# Patient Record
Sex: Male | Born: 1975 | Race: White | Hispanic: No | Marital: Married | Smoking: Never smoker
Health system: Southern US, Academic
[De-identification: ages and names within clinical notes are randomized; demographics above are authoritative.]

## PROBLEM LIST (undated history)

## (undated) DIAGNOSIS — M4850XA Collapsed vertebra, not elsewhere classified, site unspecified, initial encounter for fracture: Secondary | ICD-10-CM

## (undated) DIAGNOSIS — I10 Essential (primary) hypertension: Secondary | ICD-10-CM

## (undated) HISTORY — PX: ROTATOR CUFF REPAIR W/ DISTAL CLAVICLE EXCISION: SHX2365

---

## 2005-02-13 ENCOUNTER — Emergency Department (HOSPITAL_COMMUNITY): Payer: Self-pay

## 2022-06-17 ENCOUNTER — Emergency Department (HOSPITAL_COMMUNITY): Payer: Worker's Compensation

## 2022-06-17 ENCOUNTER — Emergency Department
Admission: EM | Admit: 2022-06-17 | Discharge: 2022-06-17 | Disposition: A | Discharge status: Home / Self Care | Payer: Worker's Compensation | Attending: Family | Admitting: Family

## 2022-06-17 ENCOUNTER — Encounter (HOSPITAL_COMMUNITY): Payer: Self-pay

## 2022-06-17 ENCOUNTER — Other Ambulatory Visit: Payer: Self-pay

## 2022-06-17 DIAGNOSIS — Z042 Encounter for examination and observation following work accident: Secondary | ICD-10-CM

## 2022-06-17 DIAGNOSIS — S8991XA Unspecified injury of right lower leg, initial encounter: Secondary | ICD-10-CM | POA: Insufficient documentation

## 2022-06-17 DIAGNOSIS — M7731 Calcaneal spur, right foot: Secondary | ICD-10-CM | POA: Insufficient documentation

## 2022-06-17 DIAGNOSIS — Y990 Civilian activity done for income or pay: Secondary | ICD-10-CM | POA: Insufficient documentation

## 2022-06-17 DIAGNOSIS — S39012A Strain of muscle, fascia and tendon of lower back, initial encounter: Secondary | ICD-10-CM | POA: Insufficient documentation

## 2022-06-17 DIAGNOSIS — S93401A Sprain of unspecified ligament of right ankle, initial encounter: Secondary | ICD-10-CM | POA: Insufficient documentation

## 2022-06-17 DIAGNOSIS — Y92149 Unspecified place in prison as the place of occurrence of the external cause: Secondary | ICD-10-CM

## 2022-06-17 DIAGNOSIS — M48061 Spinal stenosis, lumbar region without neurogenic claudication: Secondary | ICD-10-CM | POA: Insufficient documentation

## 2022-06-17 HISTORY — DX: Essential (primary) hypertension: I10

## 2022-06-17 HISTORY — DX: Collapsed vertebra, not elsewhere classified, site unspecified, initial encounter for fracture (CMS HCC): M48.50XA

## 2022-06-17 MED ORDER — ACETAMINOPHEN 325 MG TABLET
975.0000 mg | ORAL_TABLET | ORAL | Status: AC
Start: 2022-06-18 — End: 2022-06-17
  Administered 2022-06-17: 975 mg via ORAL

## 2022-06-17 NOTE — Discharge Instructions (Signed)
Tylenol and or Motrin as needed for pain.
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
## 2022-06-17 NOTE — ED Nurses Note (Signed)
Patient discharged home with family.  AVS reviewed with patient/care giver.  A written copy of the AVS and discharge instructions was given to the patient/care giver. Scripts handed to patient/care giver. Questions sufficiently answered as needed.  Patient/care giver encouraged to follow up with PCP as indicated.  In the event of an emergency, patient/care giver instructed to call 911 or go to the nearest emergency room.
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
